# Patient Record
Sex: Female | Born: 1960 | Race: White | Hispanic: No | Marital: Married | Smoking: Never smoker
Health system: Southern US, Community
[De-identification: ages and names within clinical notes are randomized; demographics above are authoritative.]

## PROBLEM LIST (undated history)

## (undated) DIAGNOSIS — F419 Anxiety disorder, unspecified: Secondary | ICD-10-CM

## (undated) DIAGNOSIS — M1990 Unspecified osteoarthritis, unspecified site: Secondary | ICD-10-CM

## (undated) DIAGNOSIS — E079 Disorder of thyroid, unspecified: Secondary | ICD-10-CM

## (undated) DIAGNOSIS — T7840XA Allergy, unspecified, initial encounter: Secondary | ICD-10-CM

## (undated) DIAGNOSIS — Z5189 Encounter for other specified aftercare: Secondary | ICD-10-CM

## (undated) HISTORY — DX: Allergy, unspecified, initial encounter: T78.40XA

## (undated) HISTORY — DX: Anxiety disorder, unspecified: F41.9

## (undated) HISTORY — PX: FRACTURE SURGERY: SHX138

## (undated) HISTORY — DX: Encounter for other specified aftercare: Z51.89

## (undated) HISTORY — DX: Disorder of thyroid, unspecified: E07.9

## (undated) HISTORY — PX: AUGMENTATION MAMMAPLASTY: SUR837

## (undated) HISTORY — PX: BREAST SURGERY: SHX581

## (undated) HISTORY — DX: Unspecified osteoarthritis, unspecified site: M19.90

---

## 1998-05-10 ENCOUNTER — Other Ambulatory Visit: Admission: RE | Admit: 1998-05-10 | Discharge: 1998-05-10 | Payer: Self-pay | Admitting: Obstetrics and Gynecology

## 2003-09-29 ENCOUNTER — Other Ambulatory Visit: Admission: RE | Admit: 2003-09-29 | Discharge: 2003-09-29 | Payer: Self-pay | Admitting: Obstetrics and Gynecology

## 2004-07-04 ENCOUNTER — Ambulatory Visit (HOSPITAL_COMMUNITY): Admission: RE | Admit: 2004-07-04 | Discharge: 2004-07-04 | Payer: Self-pay | Admitting: Obstetrics and Gynecology

## 2005-01-03 ENCOUNTER — Other Ambulatory Visit: Admission: RE | Admit: 2005-01-03 | Discharge: 2005-01-03 | Payer: Self-pay | Admitting: Obstetrics and Gynecology

## 2005-04-14 ENCOUNTER — Emergency Department (HOSPITAL_COMMUNITY): Admission: EM | Admit: 2005-04-14 | Discharge: 2005-04-14 | Payer: Self-pay | Admitting: Emergency Medicine

## 2005-09-12 ENCOUNTER — Ambulatory Visit (HOSPITAL_COMMUNITY): Admission: RE | Admit: 2005-09-12 | Discharge: 2005-09-12 | Payer: Self-pay | Admitting: Obstetrics and Gynecology

## 2006-09-01 ENCOUNTER — Other Ambulatory Visit: Admission: RE | Admit: 2006-09-01 | Discharge: 2006-09-01 | Payer: Self-pay | Admitting: Obstetrics and Gynecology

## 2006-09-19 ENCOUNTER — Emergency Department (HOSPITAL_COMMUNITY): Admission: EM | Admit: 2006-09-19 | Discharge: 2006-09-20 | Payer: Self-pay | Admitting: Emergency Medicine

## 2006-09-20 ENCOUNTER — Observation Stay (HOSPITAL_COMMUNITY): Admission: RE | Admit: 2006-09-20 | Discharge: 2006-09-21 | Payer: Self-pay | Admitting: Neurosurgery

## 2007-09-30 ENCOUNTER — Other Ambulatory Visit: Admission: RE | Admit: 2007-09-30 | Discharge: 2007-09-30 | Payer: Self-pay | Admitting: Obstetrics and Gynecology

## 2008-10-19 ENCOUNTER — Other Ambulatory Visit: Admission: RE | Admit: 2008-10-19 | Discharge: 2008-10-19 | Payer: Self-pay | Admitting: Obstetrics and Gynecology

## 2008-10-21 ENCOUNTER — Ambulatory Visit (HOSPITAL_COMMUNITY): Admission: RE | Admit: 2008-10-21 | Discharge: 2008-10-21 | Payer: Self-pay | Admitting: Obstetrics and Gynecology

## 2009-11-15 ENCOUNTER — Ambulatory Visit (HOSPITAL_COMMUNITY): Admission: RE | Admit: 2009-11-15 | Discharge: 2009-11-15 | Payer: Self-pay | Admitting: Obstetrics and Gynecology

## 2009-11-16 ENCOUNTER — Other Ambulatory Visit: Admission: RE | Admit: 2009-11-16 | Discharge: 2009-11-16 | Payer: Self-pay | Admitting: Obstetrics and Gynecology

## 2010-09-02 ENCOUNTER — Encounter: Payer: Self-pay | Admitting: Obstetrics and Gynecology

## 2010-12-26 ENCOUNTER — Other Ambulatory Visit: Payer: Self-pay | Admitting: Obstetrics and Gynecology

## 2010-12-26 ENCOUNTER — Other Ambulatory Visit (HOSPITAL_COMMUNITY)
Admission: RE | Admit: 2010-12-26 | Discharge: 2010-12-26 | Disposition: A | Discharge status: Home / Self Care | Payer: BC Managed Care – PPO | Source: Ambulatory Visit | Attending: Obstetrics and Gynecology | Admitting: Obstetrics and Gynecology

## 2010-12-26 DIAGNOSIS — Z01419 Encounter for gynecological examination (general) (routine) without abnormal findings: Secondary | ICD-10-CM | POA: Insufficient documentation

## 2010-12-28 NOTE — H&P (Signed)
NAME:  Morgan Haynes, Morgan Haynes NO.:  [id number]   MEDICAL RECORD NO.:  [id number]          PATIENT TYPE:  INP   LOCATION:  3014                         FACILITY:  [facility]   PHYSICIAN:  Donalee Citrin, M.D.        DATE OF BIRTH:  1960-10-19   DATE OF ADMISSION:  09/20/2006  DATE OF DISCHARGE:  09/21/2006                              HISTORY & PHYSICAL   REASON FOR ADMISSION:  Closed head injury, post-concussion syndrome.   HISTORY OF PRESENT ILLNESS:  Patient is a very pleasant, 50 year old  female who came to the emergency room last night after sustaining a  fall, striking her head on a granite countertop, after falling off of a  ladder while cleaning some shelves.  Patient had brief episodes of right  facial, right arm and right leg numbness and weakness that resolved over  the couple of hours she was observed in the emergency room.  Initial  head CT was normal.  Follow up MRI, to rule out dissection, was also  negative.  The patient requested discharge.  The patient has been  observed for several hours in the ER and with complete resolution of  symptoms and was felt to be stable to be discharged home.  However,  overnight patient has gotten grossly worse with headaches, nausea, not  able to keep anything down and is brought in for follow up head CT which  is normal.  However, patient needs to be admitted for hydration and  observation.   PAST MEDICAL AND PAST SURGICAL HISTORY:  Are noncontributory.   EXAM:  GENERAL:  This patient is awake, alert, oriented x4.  NEUROLOGIC:  Cranial nerves intact.  Pupils equal, round and reactive to  light.  Extraocular movements are intact.  Strength 5/5 upper and lower  extremities.  No pronator drift.  The laceration over her right eye  appears to be intact and healed.   ASSESSMENT:  Post-concussion syndrome in a 50 year old.  Patient will be  admitted to 3000 for observation, IV hydration and antiemetic control.     ______________________________  Donalee Citrin, M.D.     GC/MEDQ  D:  09/20/2006  T:  09/21/2006  Job:  [id number]

## 2010-12-28 NOTE — Consult Note (Signed)
NAME:  Morgan Haynes, Morgan Haynes NO.:  [id number]   MEDICAL RECORD NO.:  [id number]          PATIENT TYPE:  EMS   LOCATION:  MAJO                         FACILITY:  [facility]   PHYSICIAN:  Donalee Citrin, M.D.        DATE OF BIRTH:  01/03/1961   DATE OF CONSULTATION:  09/19/2006  DATE OF DISCHARGE:                                 CONSULTATION   REASON FOR CONSULTATION:  Right hemiparesis following a closed-head  injury.   HISTORY OF PRESENT ILLNESS:  The patient is a very pleasant 50 year old  female who was working, standing on a ladder, cleaning.  The next thing  she knew, she saw the ladder had fallen over and she was bleeding on the  floor.  Apparently, what had happened, was she had fallen off the  ladder, striking her head on a granite countertop, lost consciousness,  and is somewhat amnestic of the event and was experiencing right facial  numbness and right-sided arm and leg weakness and numbness over the next  several minutes and hours.  The patient was picked up by EMS and brought  to the emergency room at [facility], and the function in her right side  is slowly but progressively improving.  Currently she still complains of  numbness on the right side of her face.  Numbness in the right arm and  numbness in the right leg seems to have resolved and the movement is  getting stronger.  She denies any symptoms in the left side.  She does  have a history of having some right arm numbness that happens  periodically and spontaneously.  It also does affect the leg.  She was  in the process of getting that worked up by a medical doctor with  hormone evaluations for premenopausal.   PAST MEDICAL HISTORY:  The patient's past medical history is otherwise  significant for asthma.   PHYSICAL EXAMINATION:  GENERAL:  On exam, the patient is a very pleasant  awake and alert 50 year old female in no acute distress.  HEENT:  Remarkable for having a laceration that appears to be  treated,  over the right eye.  Her pupils are slightly anisocoric, with the right  side being slightly larger at 4 versus 3 on the left.  This is old for  her, it is kind of chronic, her husband said.  Her extraocular movements  are intact.  Cranial nerves are otherwise intact, except for slightly  decreased sensation in the right side of her face.  Upper extremity  strength is 5/5 in her deltoids, biceps; 4+/5 in the right side, 4/5 in  the right triceps.  Wrist flexion and extension are all 4+/5 and the  right lower extremity is 4+/5.  The left side is all 5/5 in her upper  and lower extremities.  Reflexes appear to be symmetric.  I see no signs  of pathologic reflexes.   Her CT scan is unremarkable, with no acute intracranial findings.   ASSESSMENT AND PLAN:  A 51 year old female with a closed-head injury,  traumatically induced right hemiparesis that seems to  be resolving.  My  concern is to rule out carotid or vertebral artery dissection, so I am  going to obtain an magnetic resonance angiography of her cervical spine  with circle of Willis diffusion-weight brain images and magnetic  resonance imaging of her cervical spine.  We will obtain these urgently  in the ER.  If they are  clear and she is otherwise stable from trauma, we may allow her to go  home to the care of her husband.  She has some significant fears and  concerns about being in the hospital and catching influenza, but we will  evaluate the MRI and go from there.           ______________________________  Donalee Citrin, M.D.     GC/MEDQ  D:  09/19/2006  T:  09/21/2006  Job:  [id number]

## 2010-12-28 NOTE — Discharge Summary (Signed)
NAME:  Morgan Haynes, Morgan Haynes NO.:  [id number]   MEDICAL RECORD NO.:  [id number]          PATIENT TYPE:  OBV   LOCATION:  3014                         FACILITY:  [facility]   PHYSICIAN:  Donalee Citrin, M.D.        DATE OF BIRTH:  Nov 27, 1960   DATE OF ADMISSION:  09/20/2006  DATE OF DISCHARGE:  09/21/2006                               DISCHARGE SUMMARY   DIAGNOSIS:  Closed head injury, postconcussive syndrome in a 50-year-  old.   HISTORY OF PRESENT ILLNESS:  Patient was admitted to the emergency room  with a postconcussive syndrome after striking a fall the night before,  hitting a granite countertop.  Patient was worked up extensively with a  negative workup.  On postop day #1, her headache was improving.  Nausea  and vomiting was improving.  Patient was able to be discharged home.   Her admission workup included a CT scan and MRI scan and MRA scan of the  circle of Willis.  All were negative.  Her presenting symptomatology,  which was predominantly headache, nausea and vomiting, but some  anisocoria and decreased sensation of the right side of her face, all  resolved by the next morning.  By the time of discharge, the patient was  neurologically intact.  The headache was well controlled on pills and is  able to follow up in two weeks.           ______________________________  Donalee Citrin, M.D.     GC/MEDQ  D:  01/01/2007  T:  01/01/2007  Job:  [id number]

## 2010-12-28 NOTE — Discharge Summary (Signed)
NAME:  Morgan Haynes, DILLAVOU NO.:  [id number]   MEDICAL RECORD NO.:  [id number]          PATIENT TYPE:  OBV   LOCATION:  3014                         FACILITY:  [facility]   PHYSICIAN:  Donalee Citrin, M.D.        DATE OF BIRTH:  1961/05/24   DATE OF ADMISSION:  09/20/2006  DATE OF DISCHARGE:  09/21/2006                               DISCHARGE SUMMARY   ADMITTING DIAGNOSIS:  Closed head injury.   HISTORY OF PRESENT ILLNESS:  The patient is a 50 year old female who was  admitted from the emergency room with a closed head injury and some  contusions.  The patient had been seen in the ER 24 hours prior and had  been discharged.  However, now the patient presents with postconcussive  syndrome.   The patient was placed on the floor with IV hydration, antiemetics, and  p.o. pain medication, and over the next 24 hours the patient did very  well.  The nausea resolved, the headache resolved, and the patient will  be discharged home, with scheduled followup in 2 weeks or as needed.           ______________________________  Donalee Citrin, M.D.     GC/MEDQ  D:  01/26/2007  T:  01/26/2007  Job:  [id number]

## 2015-01-20 LAB — HM HEPATITIS C SCREENING LAB: HM Hepatitis Screen: NEGATIVE

## 2015-06-16 LAB — HM PAP SMEAR: HM Pap smear: NEGATIVE

## 2017-03-06 ENCOUNTER — Encounter: Payer: Self-pay | Admitting: Physician Assistant

## 2017-03-06 ENCOUNTER — Ambulatory Visit (INDEPENDENT_AMBULATORY_CARE_PROVIDER_SITE_OTHER): Payer: Self-pay | Admitting: Physician Assistant

## 2017-03-06 VITALS — BP 96/60 | HR 71 | Temp 98.2°F | Ht 64.0 in | Wt 119.0 lb

## 2017-03-06 DIAGNOSIS — J0100 Acute maxillary sinusitis, unspecified: Secondary | ICD-10-CM

## 2017-03-06 DIAGNOSIS — M349 Systemic sclerosis, unspecified: Secondary | ICD-10-CM | POA: Insufficient documentation

## 2017-03-06 MED ORDER — AMOXICILLIN-POT CLAVULANATE 875-125 MG PO TABS: 1.0000 | ORAL_TABLET | Freq: Two times a day (BID) | ORAL | 0 refills | Status: AC

## 2017-03-06 NOTE — Progress Notes (Signed)
Morgan Haynes is a 56 y.o. female here to Establish Care and sore throat.  I acted as a Neurosurgeonscribe for Energy East CorporationSamantha Marshal Eskew, PA-C Corky Mullonna Orphanos, LPN  History of Present Illness:   Chief Complaint  Patient presents with  . Establish Care  . Sore Throat    x 1 day  . Facial Pain    Sinus, x 2.5 weeks  . Nasal Congestion    Acute Concerns: Sinus infection -- she reports 2.5 weeks of sinus pressure, 1 day of sore throat and intermittent nasal congestion. Husband diagnosed with strep throat. She has been taking Mucinex Sinus without relief, appetite is minimal, no diarrhea. She spends a lot of time on airplanes -- goes to [place] at least twice a month. Has had some ear pressure as well. Denies SOB, chest pain, stridor. She uses a neti pot daily. Does not like nasal sprays -- they cause sinus infections for her. Has a hx of both asthma and PNA.  Weight -- Weight: 119 lb (54 kg)   [facility]/2017  Decreased Interest 0  Down, Depressed, Hopeless 0  PHQ - 2 Score 0    No flowsheet data found.  Other providers/specialists: [facility] -- sees for her sclerosis   Past Medical History:  Diagnosis Date  . Allergy   . Anxiety   . Arthritis   . Blood transfusion without reported diagnosis   . Thyroid disease      Social History   Social History  . Marital status: Married    Spouse name: N/A  . Number of children: N/A  . Years of education: N/A   Occupational History  . Not on file.   Social History Main Topics  . Smoking status: Never Smoker  . Smokeless tobacco: Never Used  . Alcohol use No  . Drug use: No  . Sexual activity: Yes   Other Topics Concern  . Not on file   Social History Narrative   Works in [employer]   Married    Past Surgical History:  Procedure Laterality Date  . BREAST SURGERY    . FRACTURE SURGERY     Nose    History reviewed. No pertinent family history.  Allergies  Allergen Reactions  . Gluten Meal Other (See  Comments)    Severe abdominal cramping, constipation  . Lactase Nausea And Vomiting    & LBM  . Latex Rash  . Sulfa Antibiotics Itching and Palpitations    & insomia     Current Medications:   Current Outpatient Prescriptions:  .  lansoprazole (PREVACID) 15 MG capsule, Take 15 mg by mouth daily. , Disp: , Rfl:  .  levothyroxine (SYNTHROID, LEVOTHROID) 25 MCG tablet, Take 50 mcg by mouth daily before breakfast. , Disp: , Rfl:  .  valACYclovir (VALTREX) 500 MG tablet, Take 500 mg by mouth daily. , Disp: , Rfl:  .  albuterol (PROVENTIL HFA;VENTOLIN HFA) 108 (90 Base) MCG/ACT inhaler, Inhale into the lungs., Disp: , Rfl:  .  amoxicillin-clavulanate (AUGMENTIN) 875-125 MG tablet, Take 1 tablet by mouth 2 (two) times daily., Disp: 20 tablet, Rfl: 0   Review of Systems:   Review of Systems  Constitutional: Negative for chills, fever, malaise/fatigue and weight loss.  HENT: Positive for congestion, sinus pain and sore throat. Negative for ear pain, hearing loss and tinnitus.   Eyes: Negative for blurred vision.  Respiratory: Negative for cough, sputum production, shortness of breath and wheezing.   Cardiovascular: Negative for chest  pain and palpitations.    Vitals:   Vitals:   03/06/17 1436  BP: 96/60  Pulse: 71  Temp: 98.2 F (36.8 C)  TempSrc: Oral  SpO2: 99%  Weight: 119 lb (54 kg)  Height: 5\' 4"  (1.626 m)     Body mass index is 20.43 kg/m.  Physical Exam:   Physical Exam  Constitutional: She appears well-developed. She is cooperative.  Non-toxic appearance. She does not have a sickly appearance. She does not appear ill. No distress.  HENT:  Head: Normocephalic and atraumatic.  Right Ear: Tympanic membrane, external ear and ear canal normal. Tympanic membrane is not erythematous, not retracted and not bulging.  Left Ear: Tympanic membrane, external ear and ear canal normal. Tympanic membrane is not erythematous, not retracted and not bulging.  Nose: Mucosal edema and  rhinorrhea present. Right sinus exhibits maxillary sinus tenderness. Right sinus exhibits no frontal sinus tenderness. Left sinus exhibits maxillary sinus tenderness. Left sinus exhibits no frontal sinus tenderness.  Mouth/Throat: Uvula is midline and mucous membranes are normal. Posterior oropharyngeal erythema present. No posterior oropharyngeal edema. Tonsils are 1+ on the right. Tonsils are 1+ on the left. No tonsillar exudate.  Eyes: Conjunctivae and lids are normal.  Neck: Trachea normal.  Cardiovascular: Normal rate, regular rhythm, S1 normal, S2 normal and normal heart sounds.   Pulmonary/Chest: Effort normal and breath sounds normal. She has no decreased breath sounds. She has no wheezes. She has no rhonchi. She has no rales.  Lymphadenopathy:    She has cervical adenopathy.  Neurological: She is alert.  Skin: Skin is warm, dry and intact.  Psychiatric: She has a normal mood and affect. Her speech is normal and behavior is normal.  Nursing note and vitals reviewed.   Assessment and Plan:    Morgan Haynes was seen today for establish care, sore throat, facial pain and nasal congestion.  Diagnoses and all orders for this visit:  Acute non-recurrent maxillary sinusitis  Other orders -     amoxicillin-clavulanate (AUGMENTIN) 875-125 MG tablet; Take 1 tablet by mouth 2 (two) times daily.   Treat with Augmentin per orders. We briefly discussed taking an oral steroid to help with her sinus/throat inflammation but she declined, states that she likes to avoid as much medication as possible. Follow-up if symptoms worsen or persist despite treatment.  . Reviewed expectations re: course of current medical issues. . Discussed self-management of symptoms. . Outlined signs and symptoms indicating need for more acute intervention. . Patient verbalized understanding and all questions were answered. . See orders for this visit as documented in the electronic medical record. . Patient received an  After-Visit Summary.  CMA or LPN served as scribe during this visit. History, Physical, and Plan performed by medical provider. Documentation and orders reviewed and attested to.  Jarold MottoSamantha Naira Standiford, PA-C

## 2017-03-06 NOTE — Patient Instructions (Signed)
It was nice to meet you!  Start the Augmentin, take with food.  Let us know if symptoms worsen or persist despite treatment.

## 2017-05-13 ENCOUNTER — Ambulatory Visit: Payer: Self-pay | Admitting: Physician Assistant

## 2017-05-26 ENCOUNTER — Ambulatory Visit: Payer: Self-pay | Admitting: Family Medicine

## 2019-04-30 ENCOUNTER — Other Ambulatory Visit: Payer: Self-pay | Admitting: Orthopedic Surgery

## 2020-03-20 ENCOUNTER — Other Ambulatory Visit: Payer: Self-pay | Admitting: Family Medicine

## 2020-03-20 DIAGNOSIS — E039 Hypothyroidism, unspecified: Secondary | ICD-10-CM

## 2020-03-20 DIAGNOSIS — Z1231 Encounter for screening mammogram for malignant neoplasm of breast: Secondary | ICD-10-CM

## 2020-03-24 ENCOUNTER — Other Ambulatory Visit: Payer: Self-pay | Admitting: Family Medicine

## 2020-03-24 DIAGNOSIS — Z1231 Encounter for screening mammogram for malignant neoplasm of breast: Secondary | ICD-10-CM

## 2020-03-24 DIAGNOSIS — E039 Hypothyroidism, unspecified: Secondary | ICD-10-CM

## 2020-04-28 ENCOUNTER — Other Ambulatory Visit: Payer: Self-pay | Admitting: Family Medicine

## 2020-04-28 DIAGNOSIS — N6310 Unspecified lump in the right breast, unspecified quadrant: Secondary | ICD-10-CM

## 2020-05-31 ENCOUNTER — Ambulatory Visit
Admission: RE | Admit: 2020-05-31 | Discharge: 2020-05-31 | Disposition: A | Discharge status: Home / Self Care | Payer: Managed Care, Other (non HMO) | Source: Ambulatory Visit | Attending: Family Medicine | Admitting: Family Medicine

## 2020-05-31 ENCOUNTER — Other Ambulatory Visit: Payer: Self-pay

## 2020-05-31 DIAGNOSIS — N6310 Unspecified lump in the right breast, unspecified quadrant: Secondary | ICD-10-CM

## 2020-07-03 ENCOUNTER — Other Ambulatory Visit: Payer: Self-pay

## 2020-10-13 ENCOUNTER — Other Ambulatory Visit: Payer: Self-pay

## 2022-01-22 IMAGING — US US BREAST*R* LIMITED INC AXILLA
1 series · 7 of 7 positions shown · non-contrast
Comparison: Previous exam(s).

CLINICAL DATA: 59-year-old female with a palpable right breast lump
for 2 months. Patient has a history of augmentation mammoplasty with
subsequent removal in 3301. Additionally, the patient has a history
of scleroderma with formation of calcium deposits within the skin.

EXAM:
DIGITAL DIAGNOSTIC BILATERAL MAMMOGRAM WITH CAD AND TOMO
ULTRASOUND RIGHT BREAST

[Series 1: us breast*right* limited inc axilla · 0.06mm/px · 7 of 7 slices shown]
[im 1/7]
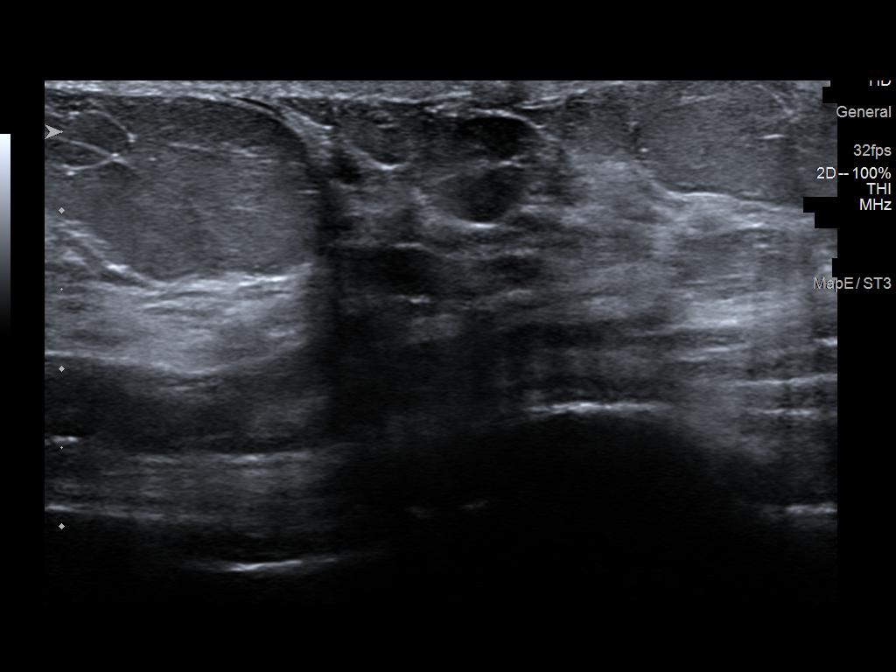
[im 2/7]
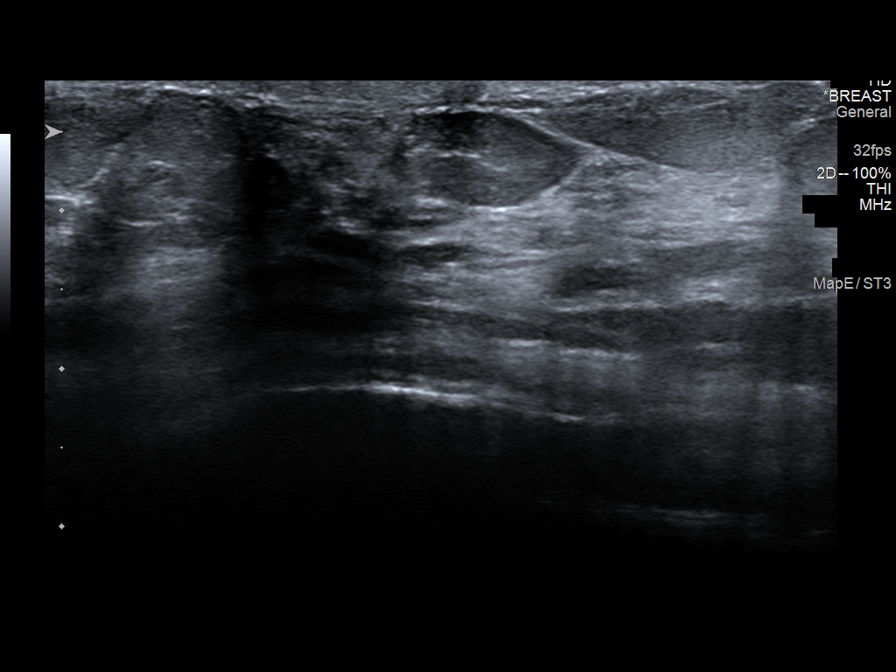
[im 3/7]
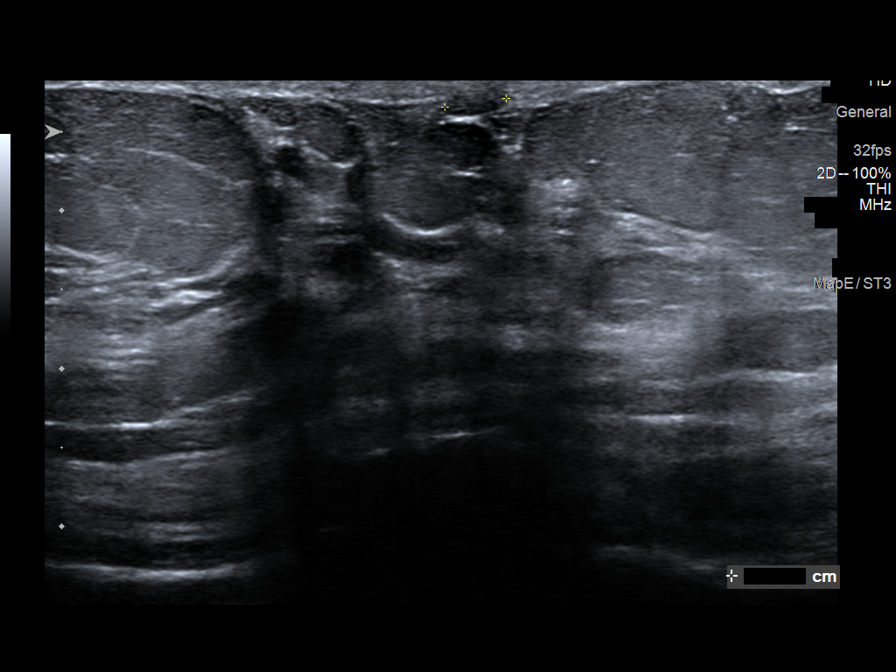
[im 4/7]
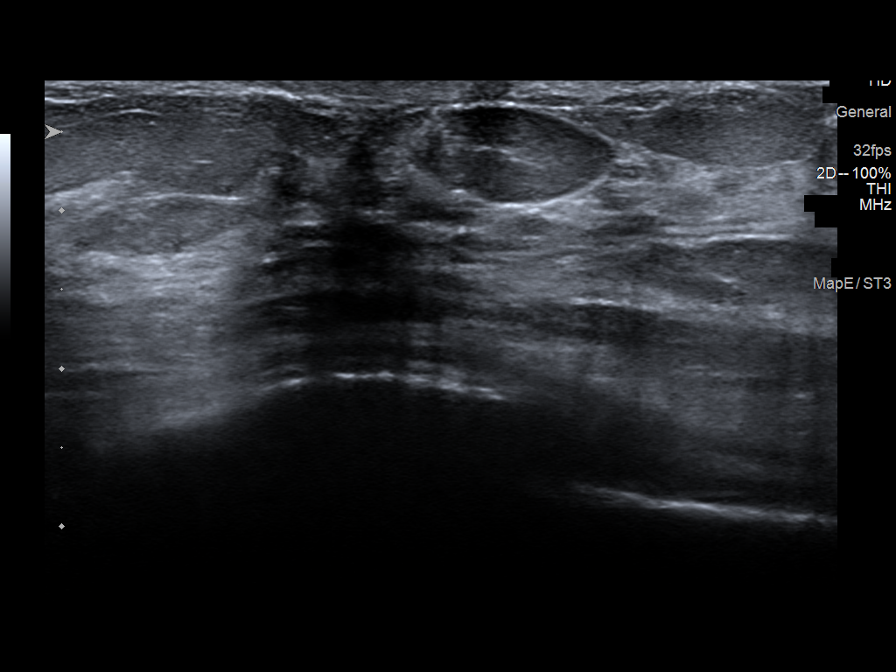
[im 5/7]
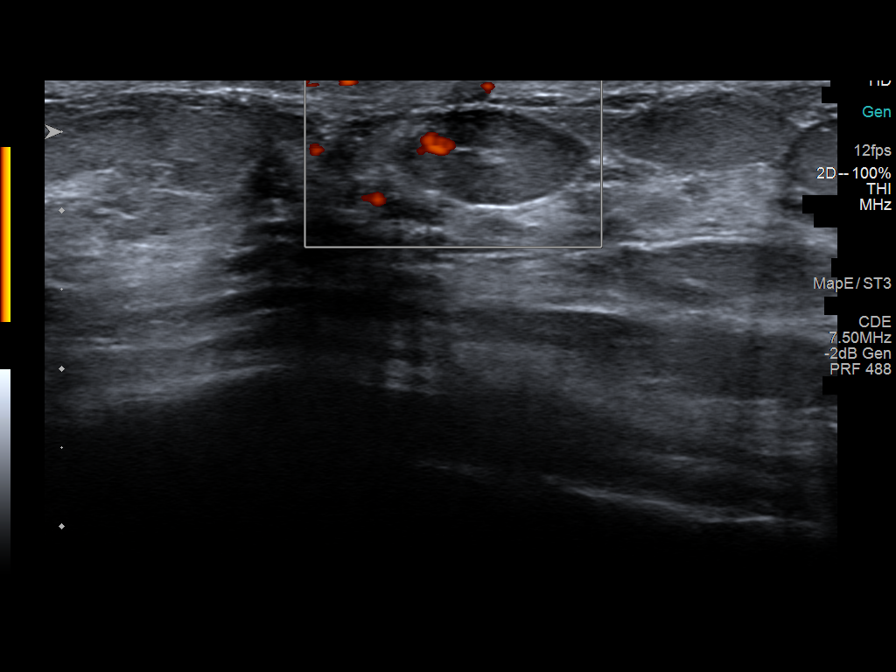
[im 6/7]
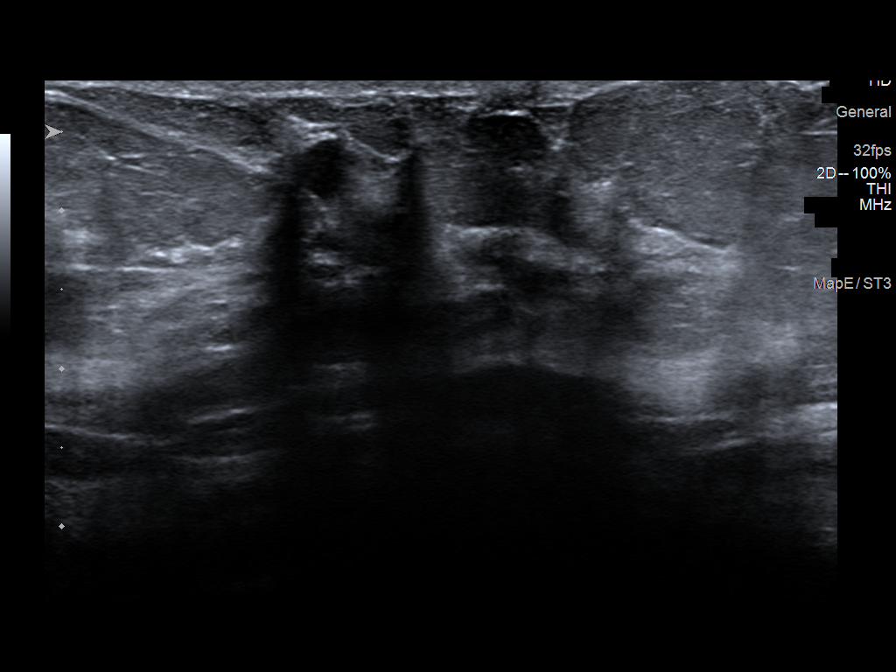
[im 7/7]
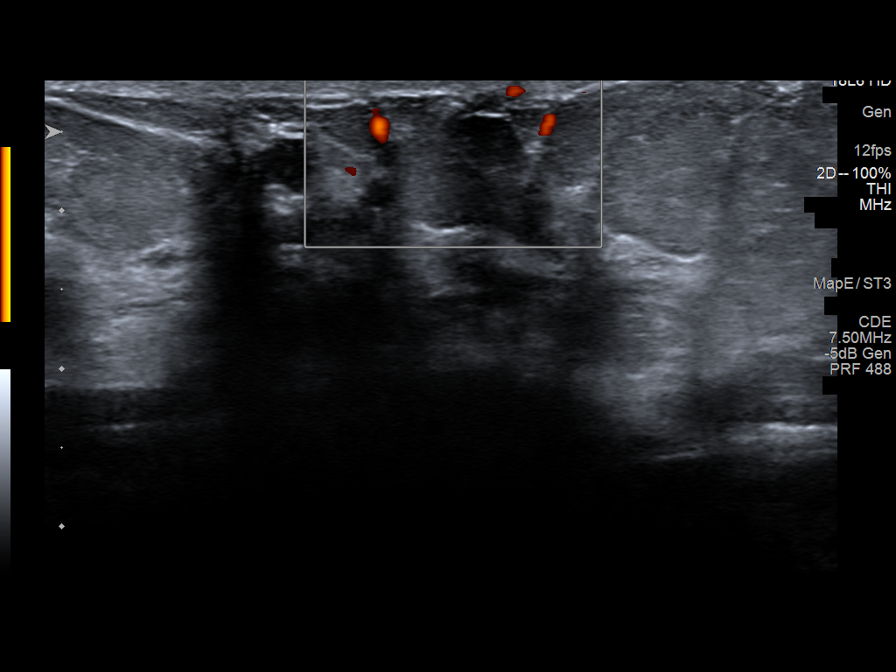

[7 of 7 positions shown; findings below may reference images not displayed]

ACR Breast Density Category c: The breast tissue is heterogeneously
dense, which may obscure small masses.
FINDINGS: A radiopaque BB was placed at the site of the patient's palpable
lump in the periareolar right breast. This is seen in association
with a linear calcium deposit, superficially within the patient's
periareolar scar. No additional suspicious findings are identified
in either breast.

Mammographic images were processed with CAD.

Targeted ultrasound is performed, showing focal scar extending
within the skin in the periareolar right breast at the 6 o'clock
position. This corresponds with the patient's site of palpable
abnormality and linear calcium deposits identified mammographically.
No suspicious sonographic findings are identified.
IMPRESSION: 1. Benign scar tissue formation corresponding with the patient's
right breast palpable lump. No suspicious mammographic or
sonographic findings are identified in this region.
2. Otherwise, no mammographic evidence of malignancy in either
breast.

RECOMMENDATION:
1. Clinical follow-up recommended for the palpable area of concern
in the right breast. Any further workup should be based on clinical
grounds.
2.  Screening mammogram in one year.(Code:JO-D-N41)

I have discussed the findings and recommendations with the patient.
If applicable, a reminder letter will be sent to the patient
regarding the next appointment.

BI-RADS CATEGORY  2: Benign.

## 2022-01-22 IMAGING — MG DIGITAL DIAGNOSTIC BILAT W/ TOMO W/ CAD
6 of 10 series · 6 of 30 positions shown · non-contrast
Comparison: Previous exam(s).

CLINICAL DATA: 59-year-old female with a palpable right breast lump
for 2 months. Patient has a history of augmentation mammoplasty with
subsequent removal in 3301. Additionally, the patient has a history
of scleroderma with formation of calcium deposits within the skin.

EXAM:
DIGITAL DIAGNOSTIC BILATERAL MAMMOGRAM WITH CAD AND TOMO
ULTRASOUND RIGHT BREAST

[R CC synth-2D]
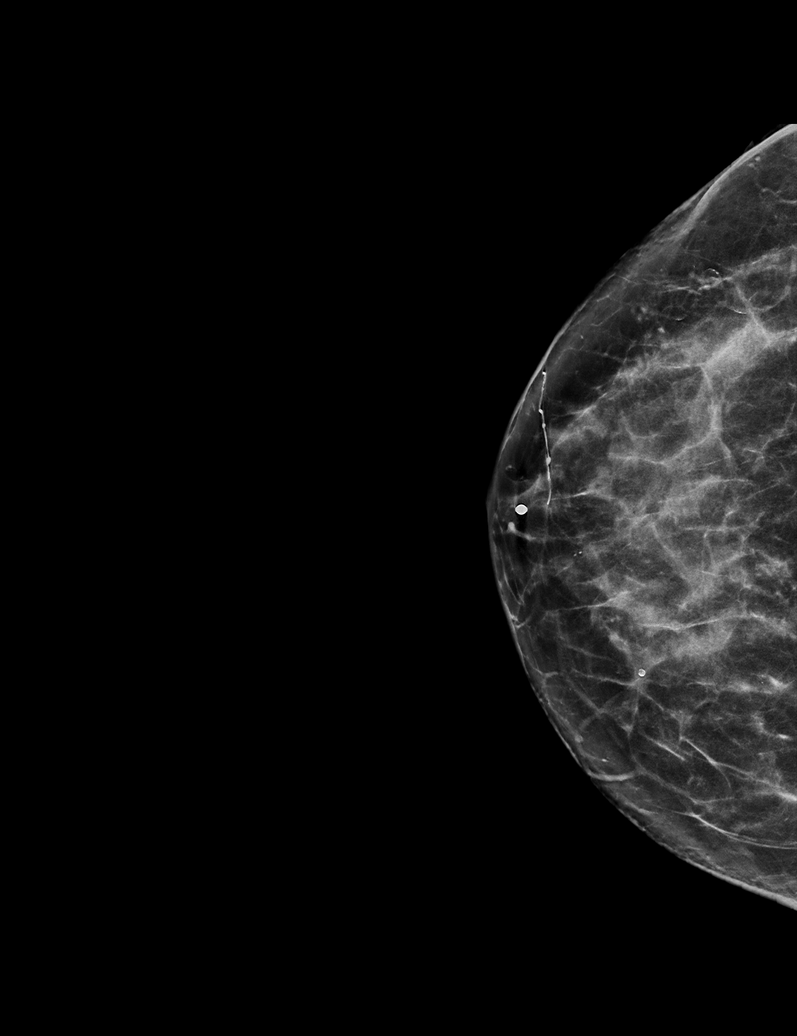

[L MLO synth-2D]
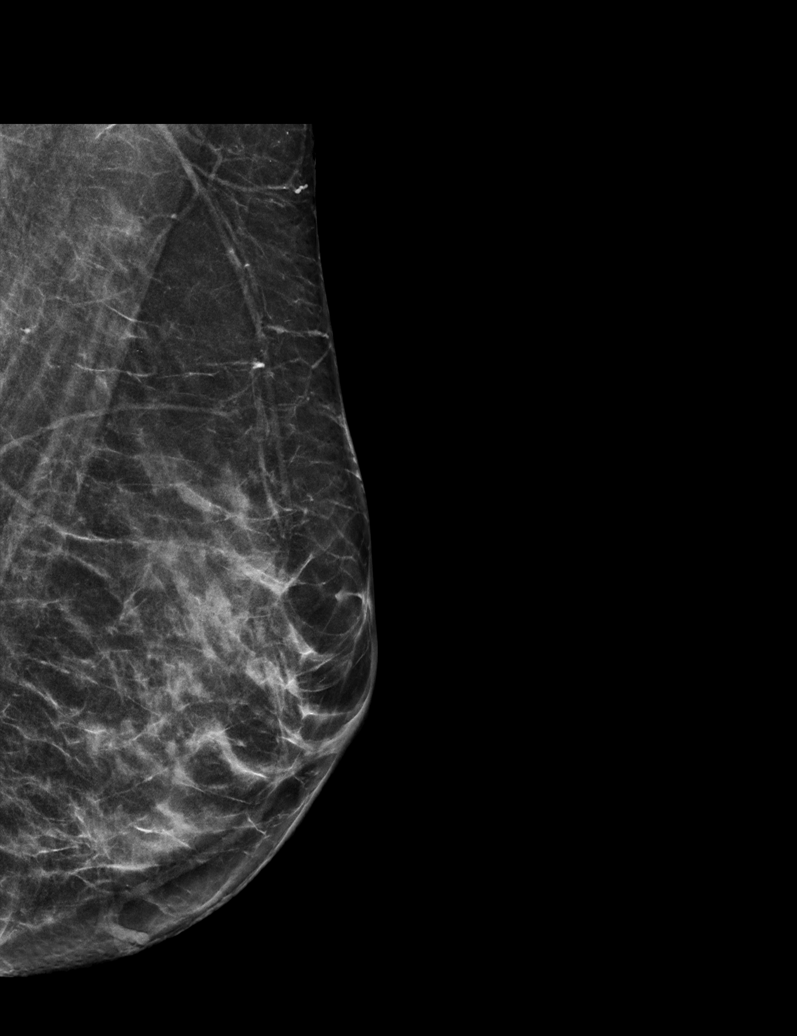

[L CC synth-2D]
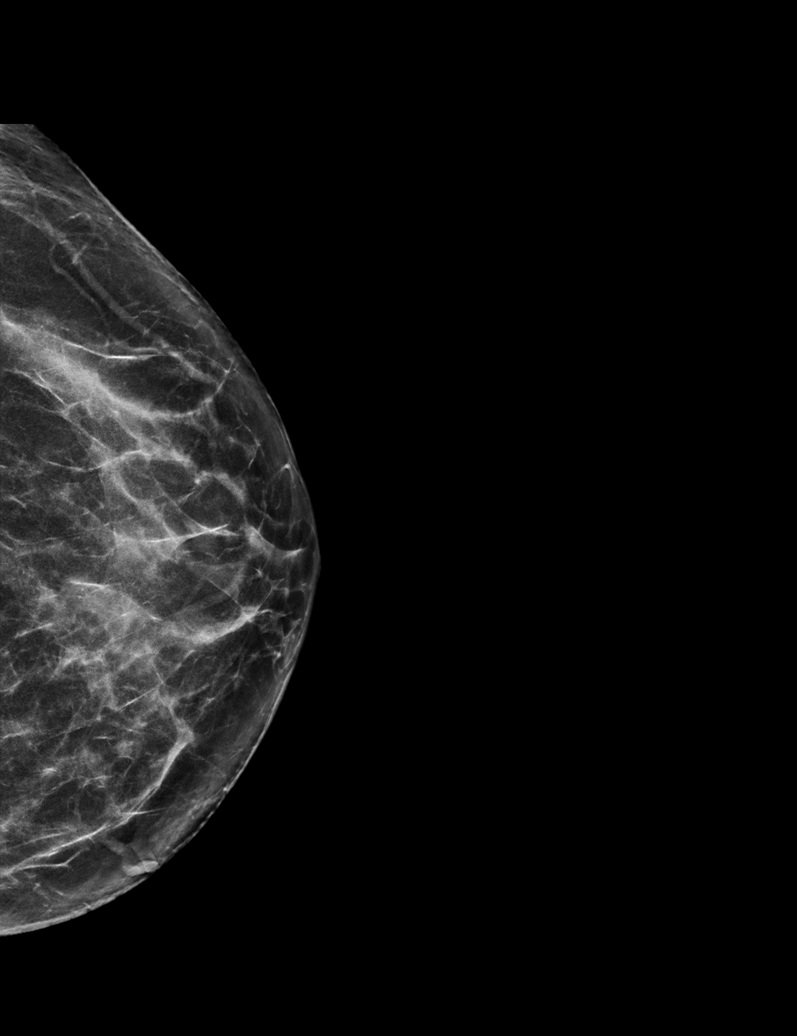

[R MLO synth-2D]
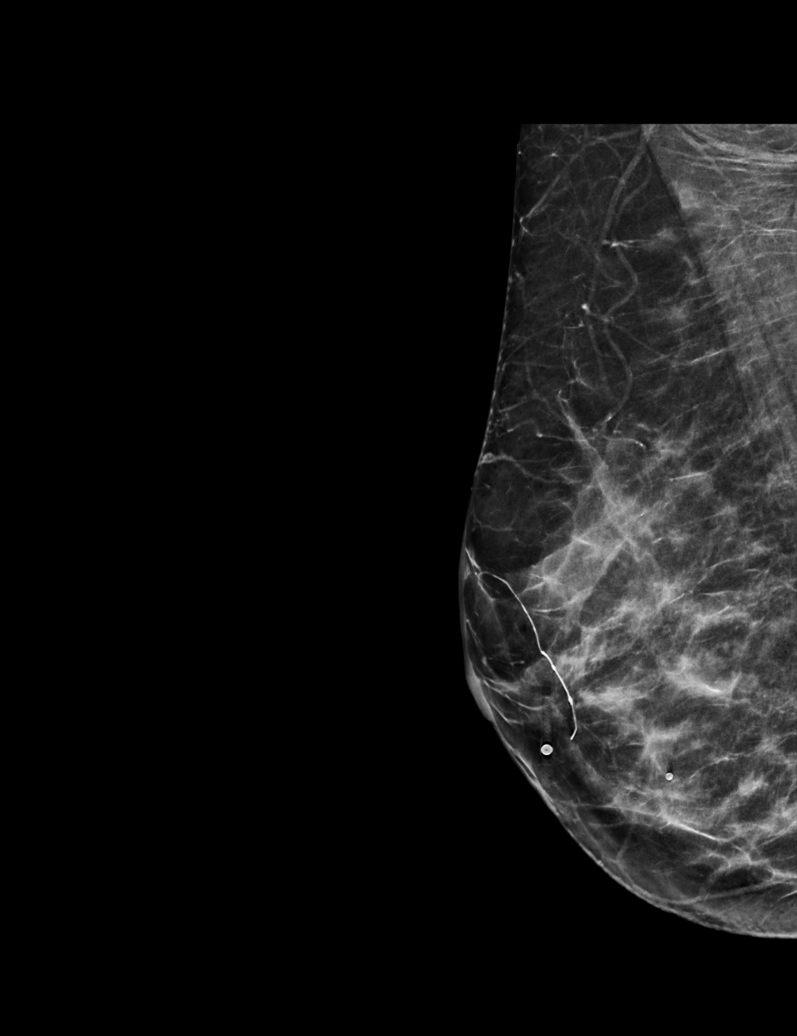

[R TAN synth-2D]
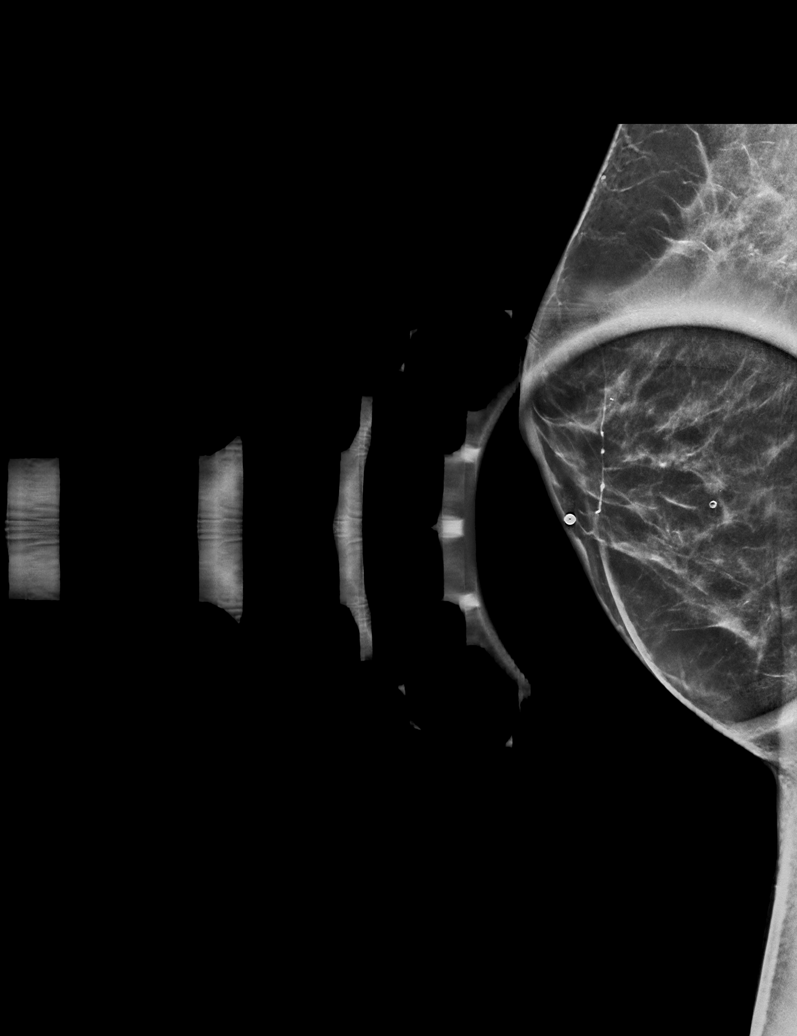

[L MLO tomo · tomo slice 31/61.0]
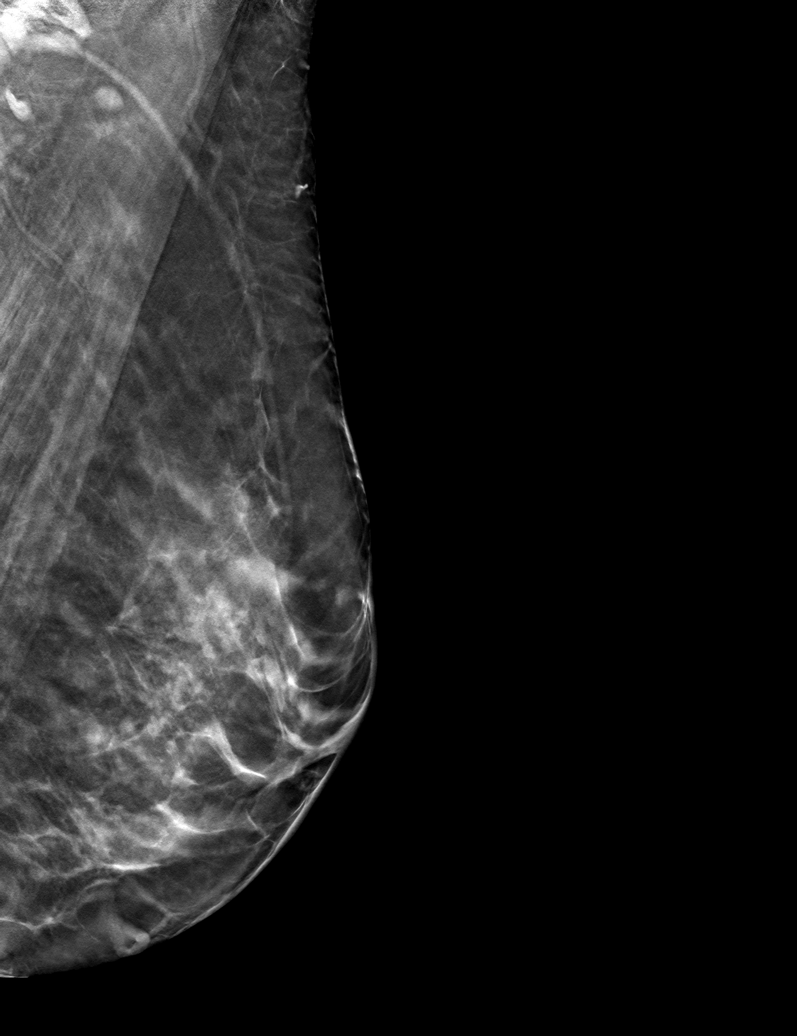

[6 of 30 positions shown; findings below may reference images not displayed]

ACR Breast Density Category c: The breast tissue is heterogeneously
dense, which may obscure small masses.
FINDINGS: A radiopaque BB was placed at the site of the patient's palpable
lump in the periareolar right breast. This is seen in association
with a linear calcium deposit, superficially within the patient's
periareolar scar. No additional suspicious findings are identified
in either breast.

Mammographic images were processed with CAD.

Targeted ultrasound is performed, showing focal scar extending
within the skin in the periareolar right breast at the 6 o'clock
position. This corresponds with the patient's site of palpable
abnormality and linear calcium deposits identified mammographically.
No suspicious sonographic findings are identified.
IMPRESSION: 1. Benign scar tissue formation corresponding with the patient's
right breast palpable lump. No suspicious mammographic or
sonographic findings are identified in this region.
2. Otherwise, no mammographic evidence of malignancy in either
breast.

RECOMMENDATION:
1. Clinical follow-up recommended for the palpable area of concern
in the right breast. Any further workup should be based on clinical
grounds.
2.  Screening mammogram in one year.(Code:JO-D-N41)

I have discussed the findings and recommendations with the patient.
If applicable, a reminder letter will be sent to the patient
regarding the next appointment.

BI-RADS CATEGORY  2: Benign.

## 2022-05-06 ENCOUNTER — Encounter: Payer: Self-pay | Admitting: *Deleted
